# Patient Record
Sex: Female | Born: 1950 | Race: White | Hispanic: Yes | Marital: Married
Health system: Southern US, Community
[De-identification: ages and names within clinical notes are randomized; demographics above are authoritative.]

## PROBLEM LIST (undated history)

## (undated) HISTORY — PX: BREAST EXCISIONAL BIOPSY: SUR124

---

## 2005-11-30 ENCOUNTER — Other Ambulatory Visit: Admission: RE | Admit: 2005-11-30 | Discharge: 2005-11-30 | Payer: Self-pay | Admitting: Obstetrics and Gynecology

## 2006-01-22 ENCOUNTER — Encounter: Admission: RE | Admit: 2006-01-22 | Discharge: 2006-01-22 | Payer: Self-pay | Admitting: Obstetrics and Gynecology

## 2006-09-04 ENCOUNTER — Encounter: Admission: RE | Admit: 2006-09-04 | Discharge: 2006-09-04 | Payer: Self-pay | Admitting: Family Medicine

## 2007-02-07 ENCOUNTER — Other Ambulatory Visit: Admission: RE | Admit: 2007-02-07 | Discharge: 2007-02-07 | Payer: Self-pay | Admitting: Obstetrics and Gynecology

## 2007-02-27 ENCOUNTER — Encounter: Admission: RE | Admit: 2007-02-27 | Discharge: 2007-02-27 | Payer: Self-pay | Admitting: Obstetrics and Gynecology

## 2008-03-04 ENCOUNTER — Encounter: Admission: RE | Admit: 2008-03-04 | Discharge: 2008-03-04 | Payer: Self-pay | Admitting: Obstetrics and Gynecology

## 2008-03-10 ENCOUNTER — Encounter: Admission: RE | Admit: 2008-03-10 | Discharge: 2008-03-10 | Payer: Self-pay | Admitting: Obstetrics and Gynecology

## 2009-04-22 ENCOUNTER — Encounter: Admission: RE | Admit: 2009-04-22 | Discharge: 2009-04-22 | Payer: Self-pay | Admitting: Obstetrics and Gynecology

## 2010-04-12 ENCOUNTER — Other Ambulatory Visit: Payer: Self-pay | Admitting: Obstetrics and Gynecology

## 2010-04-12 DIAGNOSIS — Z1231 Encounter for screening mammogram for malignant neoplasm of breast: Secondary | ICD-10-CM

## 2010-05-02 ENCOUNTER — Ambulatory Visit
Admission: RE | Admit: 2010-05-02 | Discharge: 2010-05-02 | Disposition: A | Discharge status: Home / Self Care | Payer: BC Managed Care – PPO | Source: Ambulatory Visit | Attending: Obstetrics and Gynecology | Admitting: Obstetrics and Gynecology

## 2010-05-02 DIAGNOSIS — Z1231 Encounter for screening mammogram for malignant neoplasm of breast: Secondary | ICD-10-CM

## 2011-09-12 ENCOUNTER — Other Ambulatory Visit: Payer: Self-pay | Admitting: Obstetrics and Gynecology

## 2011-09-12 DIAGNOSIS — Z1231 Encounter for screening mammogram for malignant neoplasm of breast: Secondary | ICD-10-CM

## 2011-10-02 ENCOUNTER — Ambulatory Visit
Admission: RE | Admit: 2011-10-02 | Discharge: 2011-10-02 | Disposition: A | Discharge status: Home / Self Care | Payer: BC Managed Care – PPO | Source: Ambulatory Visit | Attending: Obstetrics and Gynecology | Admitting: Obstetrics and Gynecology

## 2011-10-02 DIAGNOSIS — Z1231 Encounter for screening mammogram for malignant neoplasm of breast: Secondary | ICD-10-CM

## 2011-11-28 ENCOUNTER — Other Ambulatory Visit (HOSPITAL_COMMUNITY)
Admission: RE | Admit: 2011-11-28 | Discharge: 2011-11-28 | Disposition: A | Discharge status: Home / Self Care | Payer: BC Managed Care – PPO | Source: Ambulatory Visit | Attending: Obstetrics and Gynecology | Admitting: Obstetrics and Gynecology

## 2011-11-28 ENCOUNTER — Other Ambulatory Visit: Payer: Self-pay | Admitting: Obstetrics and Gynecology

## 2011-11-28 DIAGNOSIS — Z01419 Encounter for gynecological examination (general) (routine) without abnormal findings: Secondary | ICD-10-CM | POA: Insufficient documentation

## 2012-12-30 ENCOUNTER — Other Ambulatory Visit: Payer: Self-pay

## 2012-12-30 DIAGNOSIS — Z1231 Encounter for screening mammogram for malignant neoplasm of breast: Secondary | ICD-10-CM

## 2012-12-31 ENCOUNTER — Other Ambulatory Visit (HOSPITAL_COMMUNITY)
Admission: RE | Admit: 2012-12-31 | Discharge: 2012-12-31 | Disposition: A | Discharge status: Home / Self Care | Payer: BC Managed Care – PPO | Source: Ambulatory Visit | Attending: Obstetrics and Gynecology | Admitting: Obstetrics and Gynecology

## 2012-12-31 ENCOUNTER — Other Ambulatory Visit: Payer: Self-pay | Admitting: Obstetrics and Gynecology

## 2012-12-31 DIAGNOSIS — Z1151 Encounter for screening for human papillomavirus (HPV): Secondary | ICD-10-CM | POA: Insufficient documentation

## 2012-12-31 DIAGNOSIS — Z01419 Encounter for gynecological examination (general) (routine) without abnormal findings: Secondary | ICD-10-CM | POA: Insufficient documentation

## 2013-01-24 ENCOUNTER — Ambulatory Visit
Admission: RE | Admit: 2013-01-24 | Discharge: 2013-01-24 | Disposition: A | Discharge status: Home / Self Care | Payer: BC Managed Care – PPO | Source: Ambulatory Visit

## 2013-01-24 DIAGNOSIS — Z1231 Encounter for screening mammogram for malignant neoplasm of breast: Secondary | ICD-10-CM

## 2016-01-18 DIAGNOSIS — Z23 Encounter for immunization: Secondary | ICD-10-CM | POA: Diagnosis not present

## 2016-01-18 DIAGNOSIS — S46911A Strain of unspecified muscle, fascia and tendon at shoulder and upper arm level, right arm, initial encounter: Secondary | ICD-10-CM | POA: Diagnosis not present

## 2016-01-18 DIAGNOSIS — Z1211 Encounter for screening for malignant neoplasm of colon: Secondary | ICD-10-CM | POA: Diagnosis not present

## 2016-01-18 DIAGNOSIS — E7800 Pure hypercholesterolemia, unspecified: Secondary | ICD-10-CM | POA: Diagnosis not present

## 2016-01-18 DIAGNOSIS — E039 Hypothyroidism, unspecified: Secondary | ICD-10-CM | POA: Diagnosis not present

## 2016-01-18 DIAGNOSIS — Z79899 Other long term (current) drug therapy: Secondary | ICD-10-CM | POA: Diagnosis not present

## 2016-04-17 DIAGNOSIS — E039 Hypothyroidism, unspecified: Secondary | ICD-10-CM | POA: Diagnosis not present

## 2016-07-20 DIAGNOSIS — E039 Hypothyroidism, unspecified: Secondary | ICD-10-CM | POA: Diagnosis not present

## 2016-07-20 DIAGNOSIS — Z23 Encounter for immunization: Secondary | ICD-10-CM | POA: Diagnosis not present

## 2016-07-20 DIAGNOSIS — Z0000 Encounter for general adult medical examination without abnormal findings: Secondary | ICD-10-CM | POA: Diagnosis not present

## 2016-07-20 DIAGNOSIS — E7800 Pure hypercholesterolemia, unspecified: Secondary | ICD-10-CM | POA: Diagnosis not present

## 2016-07-20 DIAGNOSIS — Z79899 Other long term (current) drug therapy: Secondary | ICD-10-CM | POA: Diagnosis not present

## 2016-07-20 DIAGNOSIS — Z720 Tobacco use: Secondary | ICD-10-CM | POA: Diagnosis not present

## 2016-08-25 DIAGNOSIS — M7711 Lateral epicondylitis, right elbow: Secondary | ICD-10-CM | POA: Diagnosis not present

## 2016-08-25 DIAGNOSIS — M9901 Segmental and somatic dysfunction of cervical region: Secondary | ICD-10-CM | POA: Diagnosis not present

## 2016-08-25 DIAGNOSIS — M9907 Segmental and somatic dysfunction of upper extremity: Secondary | ICD-10-CM | POA: Diagnosis not present

## 2016-08-25 DIAGNOSIS — M9902 Segmental and somatic dysfunction of thoracic region: Secondary | ICD-10-CM | POA: Diagnosis not present

## 2016-08-31 DIAGNOSIS — M9901 Segmental and somatic dysfunction of cervical region: Secondary | ICD-10-CM | POA: Diagnosis not present

## 2016-08-31 DIAGNOSIS — M9907 Segmental and somatic dysfunction of upper extremity: Secondary | ICD-10-CM | POA: Diagnosis not present

## 2016-08-31 DIAGNOSIS — M7711 Lateral epicondylitis, right elbow: Secondary | ICD-10-CM | POA: Diagnosis not present

## 2016-08-31 DIAGNOSIS — M9902 Segmental and somatic dysfunction of thoracic region: Secondary | ICD-10-CM | POA: Diagnosis not present

## 2016-09-06 DIAGNOSIS — M9907 Segmental and somatic dysfunction of upper extremity: Secondary | ICD-10-CM | POA: Diagnosis not present

## 2016-09-06 DIAGNOSIS — M7711 Lateral epicondylitis, right elbow: Secondary | ICD-10-CM | POA: Diagnosis not present

## 2016-09-06 DIAGNOSIS — M9901 Segmental and somatic dysfunction of cervical region: Secondary | ICD-10-CM | POA: Diagnosis not present

## 2016-09-06 DIAGNOSIS — M9902 Segmental and somatic dysfunction of thoracic region: Secondary | ICD-10-CM | POA: Diagnosis not present

## 2016-09-06 DIAGNOSIS — M19021 Primary osteoarthritis, right elbow: Secondary | ICD-10-CM | POA: Diagnosis not present

## 2016-09-12 DIAGNOSIS — M9907 Segmental and somatic dysfunction of upper extremity: Secondary | ICD-10-CM | POA: Diagnosis not present

## 2016-09-12 DIAGNOSIS — M7711 Lateral epicondylitis, right elbow: Secondary | ICD-10-CM | POA: Diagnosis not present

## 2016-09-12 DIAGNOSIS — M9901 Segmental and somatic dysfunction of cervical region: Secondary | ICD-10-CM | POA: Diagnosis not present

## 2016-09-12 DIAGNOSIS — M9902 Segmental and somatic dysfunction of thoracic region: Secondary | ICD-10-CM | POA: Diagnosis not present

## 2016-09-20 DIAGNOSIS — M9901 Segmental and somatic dysfunction of cervical region: Secondary | ICD-10-CM | POA: Diagnosis not present

## 2016-09-20 DIAGNOSIS — M7711 Lateral epicondylitis, right elbow: Secondary | ICD-10-CM | POA: Diagnosis not present

## 2016-09-20 DIAGNOSIS — M9907 Segmental and somatic dysfunction of upper extremity: Secondary | ICD-10-CM | POA: Diagnosis not present

## 2016-09-20 DIAGNOSIS — M9902 Segmental and somatic dysfunction of thoracic region: Secondary | ICD-10-CM | POA: Diagnosis not present

## 2016-09-28 ENCOUNTER — Other Ambulatory Visit: Payer: Self-pay | Admitting: Obstetrics and Gynecology

## 2016-09-28 DIAGNOSIS — Z1231 Encounter for screening mammogram for malignant neoplasm of breast: Secondary | ICD-10-CM

## 2016-10-08 DIAGNOSIS — M5489 Other dorsalgia: Secondary | ICD-10-CM | POA: Diagnosis not present

## 2016-10-11 DIAGNOSIS — M9907 Segmental and somatic dysfunction of upper extremity: Secondary | ICD-10-CM | POA: Diagnosis not present

## 2016-10-11 DIAGNOSIS — M9901 Segmental and somatic dysfunction of cervical region: Secondary | ICD-10-CM | POA: Diagnosis not present

## 2016-10-11 DIAGNOSIS — M9902 Segmental and somatic dysfunction of thoracic region: Secondary | ICD-10-CM | POA: Diagnosis not present

## 2016-10-11 DIAGNOSIS — M7711 Lateral epicondylitis, right elbow: Secondary | ICD-10-CM | POA: Diagnosis not present

## 2016-10-13 DIAGNOSIS — M9902 Segmental and somatic dysfunction of thoracic region: Secondary | ICD-10-CM | POA: Diagnosis not present

## 2016-10-13 DIAGNOSIS — M9907 Segmental and somatic dysfunction of upper extremity: Secondary | ICD-10-CM | POA: Diagnosis not present

## 2016-10-13 DIAGNOSIS — M7711 Lateral epicondylitis, right elbow: Secondary | ICD-10-CM | POA: Diagnosis not present

## 2016-10-13 DIAGNOSIS — M9901 Segmental and somatic dysfunction of cervical region: Secondary | ICD-10-CM | POA: Diagnosis not present

## 2016-10-18 ENCOUNTER — Other Ambulatory Visit: Payer: Self-pay | Admitting: Obstetrics and Gynecology

## 2016-10-18 ENCOUNTER — Other Ambulatory Visit (HOSPITAL_COMMUNITY)
Admission: RE | Admit: 2016-10-18 | Discharge: 2016-10-18 | Disposition: A | Discharge status: Home / Self Care | Payer: Medicare Other | Source: Ambulatory Visit | Attending: Obstetrics and Gynecology | Admitting: Obstetrics and Gynecology

## 2016-10-18 DIAGNOSIS — Z124 Encounter for screening for malignant neoplasm of cervix: Secondary | ICD-10-CM | POA: Insufficient documentation

## 2016-10-18 DIAGNOSIS — M9907 Segmental and somatic dysfunction of upper extremity: Secondary | ICD-10-CM | POA: Diagnosis not present

## 2016-10-18 DIAGNOSIS — M9902 Segmental and somatic dysfunction of thoracic region: Secondary | ICD-10-CM | POA: Diagnosis not present

## 2016-10-18 DIAGNOSIS — Z01419 Encounter for gynecological examination (general) (routine) without abnormal findings: Secondary | ICD-10-CM | POA: Diagnosis not present

## 2016-10-18 DIAGNOSIS — M9901 Segmental and somatic dysfunction of cervical region: Secondary | ICD-10-CM | POA: Diagnosis not present

## 2016-10-18 DIAGNOSIS — M7711 Lateral epicondylitis, right elbow: Secondary | ICD-10-CM | POA: Diagnosis not present

## 2016-10-20 LAB — CYTOLOGY - PAP
Diagnosis: NEGATIVE
HPV (WINDOPATH): NOT DETECTED

## 2016-10-25 ENCOUNTER — Ambulatory Visit
Admission: RE | Admit: 2016-10-25 | Discharge: 2016-10-25 | Disposition: A | Discharge status: Home / Self Care | Payer: Medicare Other | Source: Ambulatory Visit | Attending: Obstetrics and Gynecology | Admitting: Obstetrics and Gynecology

## 2016-10-25 DIAGNOSIS — M9901 Segmental and somatic dysfunction of cervical region: Secondary | ICD-10-CM | POA: Diagnosis not present

## 2016-10-25 DIAGNOSIS — M9907 Segmental and somatic dysfunction of upper extremity: Secondary | ICD-10-CM | POA: Diagnosis not present

## 2016-10-25 DIAGNOSIS — M7711 Lateral epicondylitis, right elbow: Secondary | ICD-10-CM | POA: Diagnosis not present

## 2016-10-25 DIAGNOSIS — M9902 Segmental and somatic dysfunction of thoracic region: Secondary | ICD-10-CM | POA: Diagnosis not present

## 2016-10-25 DIAGNOSIS — Z1231 Encounter for screening mammogram for malignant neoplasm of breast: Secondary | ICD-10-CM | POA: Diagnosis not present

## 2016-10-26 ENCOUNTER — Other Ambulatory Visit: Payer: Self-pay | Admitting: Obstetrics and Gynecology

## 2016-10-26 DIAGNOSIS — R928 Other abnormal and inconclusive findings on diagnostic imaging of breast: Secondary | ICD-10-CM

## 2016-11-08 ENCOUNTER — Ambulatory Visit: Payer: Medicare Other

## 2016-11-08 ENCOUNTER — Ambulatory Visit
Admission: RE | Admit: 2016-11-08 | Discharge: 2016-11-08 | Disposition: A | Discharge status: Home / Self Care | Payer: Medicare Other | Source: Ambulatory Visit | Attending: Obstetrics and Gynecology | Admitting: Obstetrics and Gynecology

## 2016-11-08 DIAGNOSIS — M9903 Segmental and somatic dysfunction of lumbar region: Secondary | ICD-10-CM | POA: Diagnosis not present

## 2016-11-08 DIAGNOSIS — M9902 Segmental and somatic dysfunction of thoracic region: Secondary | ICD-10-CM | POA: Diagnosis not present

## 2016-11-08 DIAGNOSIS — R928 Other abnormal and inconclusive findings on diagnostic imaging of breast: Secondary | ICD-10-CM

## 2016-11-08 DIAGNOSIS — M9907 Segmental and somatic dysfunction of upper extremity: Secondary | ICD-10-CM | POA: Diagnosis not present

## 2016-11-08 DIAGNOSIS — M9901 Segmental and somatic dysfunction of cervical region: Secondary | ICD-10-CM | POA: Diagnosis not present

## 2016-11-30 DIAGNOSIS — M9907 Segmental and somatic dysfunction of upper extremity: Secondary | ICD-10-CM | POA: Diagnosis not present

## 2016-11-30 DIAGNOSIS — M9901 Segmental and somatic dysfunction of cervical region: Secondary | ICD-10-CM | POA: Diagnosis not present

## 2016-11-30 DIAGNOSIS — M9902 Segmental and somatic dysfunction of thoracic region: Secondary | ICD-10-CM | POA: Diagnosis not present

## 2016-11-30 DIAGNOSIS — M9903 Segmental and somatic dysfunction of lumbar region: Secondary | ICD-10-CM | POA: Diagnosis not present

## 2016-12-11 DIAGNOSIS — M9907 Segmental and somatic dysfunction of upper extremity: Secondary | ICD-10-CM | POA: Diagnosis not present

## 2016-12-11 DIAGNOSIS — M9903 Segmental and somatic dysfunction of lumbar region: Secondary | ICD-10-CM | POA: Diagnosis not present

## 2016-12-11 DIAGNOSIS — M9902 Segmental and somatic dysfunction of thoracic region: Secondary | ICD-10-CM | POA: Diagnosis not present

## 2016-12-11 DIAGNOSIS — M9901 Segmental and somatic dysfunction of cervical region: Secondary | ICD-10-CM | POA: Diagnosis not present

## 2017-01-01 DIAGNOSIS — M9903 Segmental and somatic dysfunction of lumbar region: Secondary | ICD-10-CM | POA: Diagnosis not present

## 2017-01-01 DIAGNOSIS — M9902 Segmental and somatic dysfunction of thoracic region: Secondary | ICD-10-CM | POA: Diagnosis not present

## 2017-01-01 DIAGNOSIS — M9901 Segmental and somatic dysfunction of cervical region: Secondary | ICD-10-CM | POA: Diagnosis not present

## 2017-01-01 DIAGNOSIS — M9907 Segmental and somatic dysfunction of upper extremity: Secondary | ICD-10-CM | POA: Diagnosis not present

## 2017-01-22 DIAGNOSIS — Z23 Encounter for immunization: Secondary | ICD-10-CM | POA: Diagnosis not present

## 2017-07-16 DIAGNOSIS — H353121 Nonexudative age-related macular degeneration, left eye, early dry stage: Secondary | ICD-10-CM | POA: Diagnosis not present

## 2017-07-16 DIAGNOSIS — H2513 Age-related nuclear cataract, bilateral: Secondary | ICD-10-CM | POA: Diagnosis not present

## 2017-09-27 ENCOUNTER — Other Ambulatory Visit: Payer: Self-pay | Admitting: Obstetrics and Gynecology

## 2017-09-27 DIAGNOSIS — Z1231 Encounter for screening mammogram for malignant neoplasm of breast: Secondary | ICD-10-CM

## 2017-10-26 ENCOUNTER — Ambulatory Visit
Admission: RE | Admit: 2017-10-26 | Discharge: 2017-10-26 | Disposition: A | Discharge status: Home / Self Care | Payer: Medicare Other | Source: Ambulatory Visit | Attending: Obstetrics and Gynecology | Admitting: Obstetrics and Gynecology

## 2017-10-26 DIAGNOSIS — Z1231 Encounter for screening mammogram for malignant neoplasm of breast: Secondary | ICD-10-CM

## 2017-11-14 DIAGNOSIS — E039 Hypothyroidism, unspecified: Secondary | ICD-10-CM | POA: Diagnosis not present

## 2017-11-14 DIAGNOSIS — E7800 Pure hypercholesterolemia, unspecified: Secondary | ICD-10-CM | POA: Diagnosis not present

## 2017-11-14 DIAGNOSIS — Z23 Encounter for immunization: Secondary | ICD-10-CM | POA: Diagnosis not present

## 2017-11-14 DIAGNOSIS — Z720 Tobacco use: Secondary | ICD-10-CM | POA: Diagnosis not present

## 2017-11-14 DIAGNOSIS — Z79899 Other long term (current) drug therapy: Secondary | ICD-10-CM | POA: Diagnosis not present

## 2017-11-14 DIAGNOSIS — Z0001 Encounter for general adult medical examination with abnormal findings: Secondary | ICD-10-CM | POA: Diagnosis not present

## 2018-02-28 DIAGNOSIS — K2900 Acute gastritis without bleeding: Secondary | ICD-10-CM | POA: Diagnosis not present

## 2018-02-28 DIAGNOSIS — R109 Unspecified abdominal pain: Secondary | ICD-10-CM | POA: Diagnosis not present

## 2018-02-28 DIAGNOSIS — F418 Other specified anxiety disorders: Secondary | ICD-10-CM | POA: Diagnosis not present

## 2018-07-23 DIAGNOSIS — H353131 Nonexudative age-related macular degeneration, bilateral, early dry stage: Secondary | ICD-10-CM | POA: Diagnosis not present

## 2018-07-23 DIAGNOSIS — H2513 Age-related nuclear cataract, bilateral: Secondary | ICD-10-CM | POA: Diagnosis not present

## 2018-09-18 ENCOUNTER — Other Ambulatory Visit: Payer: Self-pay | Admitting: Obstetrics and Gynecology

## 2018-09-18 DIAGNOSIS — Z1231 Encounter for screening mammogram for malignant neoplasm of breast: Secondary | ICD-10-CM

## 2018-10-21 DIAGNOSIS — Z01419 Encounter for gynecological examination (general) (routine) without abnormal findings: Secondary | ICD-10-CM | POA: Diagnosis not present

## 2018-10-21 DIAGNOSIS — M8580 Other specified disorders of bone density and structure, unspecified site: Secondary | ICD-10-CM | POA: Diagnosis not present

## 2018-10-22 ENCOUNTER — Other Ambulatory Visit: Payer: Self-pay | Admitting: Obstetrics and Gynecology

## 2018-10-22 DIAGNOSIS — M8580 Other specified disorders of bone density and structure, unspecified site: Secondary | ICD-10-CM

## 2018-11-07 ENCOUNTER — Ambulatory Visit
Admission: RE | Admit: 2018-11-07 | Discharge: 2018-11-07 | Disposition: A | Discharge status: Home / Self Care | Payer: Medicare Other | Source: Ambulatory Visit | Attending: Obstetrics and Gynecology | Admitting: Obstetrics and Gynecology

## 2018-11-07 ENCOUNTER — Other Ambulatory Visit: Payer: Self-pay

## 2018-11-07 DIAGNOSIS — Z1231 Encounter for screening mammogram for malignant neoplasm of breast: Secondary | ICD-10-CM | POA: Diagnosis not present

## 2018-11-26 DIAGNOSIS — Z1211 Encounter for screening for malignant neoplasm of colon: Secondary | ICD-10-CM | POA: Diagnosis not present

## 2018-11-26 DIAGNOSIS — Z79899 Other long term (current) drug therapy: Secondary | ICD-10-CM | POA: Diagnosis not present

## 2018-11-26 DIAGNOSIS — Z0001 Encounter for general adult medical examination with abnormal findings: Secondary | ICD-10-CM | POA: Diagnosis not present

## 2018-11-26 DIAGNOSIS — E7800 Pure hypercholesterolemia, unspecified: Secondary | ICD-10-CM | POA: Diagnosis not present

## 2018-11-26 DIAGNOSIS — E039 Hypothyroidism, unspecified: Secondary | ICD-10-CM | POA: Diagnosis not present

## 2018-11-26 DIAGNOSIS — Z23 Encounter for immunization: Secondary | ICD-10-CM | POA: Diagnosis not present

## 2018-11-26 DIAGNOSIS — Z720 Tobacco use: Secondary | ICD-10-CM | POA: Diagnosis not present

## 2018-11-28 DIAGNOSIS — Z1211 Encounter for screening for malignant neoplasm of colon: Secondary | ICD-10-CM | POA: Diagnosis not present

## 2019-01-02 ENCOUNTER — Other Ambulatory Visit: Payer: Self-pay

## 2019-01-02 ENCOUNTER — Ambulatory Visit
Admission: RE | Admit: 2019-01-02 | Discharge: 2019-01-02 | Disposition: A | Discharge status: Home / Self Care | Payer: Medicare Other | Source: Ambulatory Visit | Attending: Obstetrics and Gynecology | Admitting: Obstetrics and Gynecology

## 2019-01-02 DIAGNOSIS — Z780 Asymptomatic menopausal state: Secondary | ICD-10-CM | POA: Diagnosis not present

## 2019-01-02 DIAGNOSIS — M8588 Other specified disorders of bone density and structure, other site: Secondary | ICD-10-CM | POA: Diagnosis not present

## 2019-01-02 DIAGNOSIS — M810 Age-related osteoporosis without current pathological fracture: Secondary | ICD-10-CM | POA: Diagnosis not present

## 2019-01-02 DIAGNOSIS — M8580 Other specified disorders of bone density and structure, unspecified site: Secondary | ICD-10-CM

## 2019-04-06 ENCOUNTER — Ambulatory Visit: Payer: Medicare Other | Attending: Internal Medicine

## 2019-04-06 DIAGNOSIS — Z23 Encounter for immunization: Secondary | ICD-10-CM | POA: Insufficient documentation

## 2019-04-06 NOTE — Progress Notes (Signed)
   Covid-19 Vaccination Clinic  Name:  Debra Barton    MRN: [id number] DOB: [birth year]  04/06/2019  Debra Barton was observed post Covid-19 immunization for 15 minutes without incidence. She was provided with Vaccine Information Sheet and instruction to access the V-Safe system.   Debra Barton was instructed to call 911 with any severe reactions post vaccine: Marland Kitchen Difficulty breathing  . Swelling of your face and throat  . A fast heartbeat  . A bad rash all over your body  . Dizziness and weakness    Immunizations Administered    Name Date Dose VIS Date Route   Pfizer COVID-19 Vaccine 04/06/2019  5:05 PM 0.3 mL 02/07/2019 Intramuscular   Manufacturer: [employer], [employer]   Lot: [id number]   NDC: [id number]

## 2019-04-27 ENCOUNTER — Ambulatory Visit: Payer: Medicare Other

## 2019-05-01 ENCOUNTER — Ambulatory Visit: Payer: Medicare Other | Attending: Internal Medicine

## 2019-05-01 DIAGNOSIS — Z23 Encounter for immunization: Secondary | ICD-10-CM | POA: Insufficient documentation

## 2019-05-01 NOTE — Progress Notes (Signed)
   Covid-19 Vaccination Clinic  Name:  Raseel Jans    MRN: [id number] DOB: 28-Mar-1950  05/01/2019  Ms. Albright was observed post Covid-19 immunization for 15 minutes without incident. She was provided with Vaccine Information Sheet and instruction to access the V-Safe system.   Ms. Leopard was instructed to call 911 with any severe reactions post vaccine: Marland Kitchen Difficulty breathing  . Swelling of face and throat  . A fast heartbeat  . A bad rash all over body  . Dizziness and weakness   Immunizations Administered    Name Date Dose VIS Date Route   Pfizer COVID-19 Vaccine 05/01/2019  1:22 PM 0.3 mL 02/07/2019 Intramuscular   Manufacturer: [employer], [employer]   Lot: [id number]   NDC: [address]-0

## 2019-07-30 DIAGNOSIS — H353131 Nonexudative age-related macular degeneration, bilateral, early dry stage: Secondary | ICD-10-CM | POA: Diagnosis not present

## 2019-07-30 DIAGNOSIS — H2513 Age-related nuclear cataract, bilateral: Secondary | ICD-10-CM | POA: Diagnosis not present

## 2019-10-14 ENCOUNTER — Other Ambulatory Visit: Payer: Self-pay | Admitting: Obstetrics and Gynecology

## 2019-10-14 DIAGNOSIS — Z1231 Encounter for screening mammogram for malignant neoplasm of breast: Secondary | ICD-10-CM

## 2019-11-05 DIAGNOSIS — Z23 Encounter for immunization: Secondary | ICD-10-CM | POA: Diagnosis not present

## 2019-11-10 DIAGNOSIS — L820 Inflamed seborrheic keratosis: Secondary | ICD-10-CM | POA: Diagnosis not present

## 2019-11-11 ENCOUNTER — Ambulatory Visit
Admission: RE | Admit: 2019-11-11 | Discharge: 2019-11-11 | Disposition: A | Discharge status: Home / Self Care | Payer: Medicare Other | Source: Ambulatory Visit | Attending: Obstetrics and Gynecology | Admitting: Obstetrics and Gynecology

## 2019-11-11 ENCOUNTER — Other Ambulatory Visit: Payer: Self-pay

## 2019-11-11 DIAGNOSIS — Z1231 Encounter for screening mammogram for malignant neoplasm of breast: Secondary | ICD-10-CM | POA: Diagnosis not present

## 2019-12-17 DIAGNOSIS — Z0001 Encounter for general adult medical examination with abnormal findings: Secondary | ICD-10-CM | POA: Diagnosis not present

## 2019-12-17 DIAGNOSIS — M810 Age-related osteoporosis without current pathological fracture: Secondary | ICD-10-CM | POA: Diagnosis not present

## 2019-12-17 DIAGNOSIS — E039 Hypothyroidism, unspecified: Secondary | ICD-10-CM | POA: Diagnosis not present

## 2019-12-17 DIAGNOSIS — Z79899 Other long term (current) drug therapy: Secondary | ICD-10-CM | POA: Diagnosis not present

## 2019-12-17 DIAGNOSIS — F418 Other specified anxiety disorders: Secondary | ICD-10-CM | POA: Diagnosis not present

## 2019-12-17 DIAGNOSIS — E7800 Pure hypercholesterolemia, unspecified: Secondary | ICD-10-CM | POA: Diagnosis not present

## 2019-12-17 DIAGNOSIS — Z720 Tobacco use: Secondary | ICD-10-CM | POA: Diagnosis not present

## 2019-12-30 DIAGNOSIS — Z1211 Encounter for screening for malignant neoplasm of colon: Secondary | ICD-10-CM | POA: Diagnosis not present

## 2020-01-10 ENCOUNTER — Ambulatory Visit: Payer: Medicare Other | Attending: Internal Medicine

## 2020-01-10 DIAGNOSIS — Z23 Encounter for immunization: Secondary | ICD-10-CM

## 2020-01-10 NOTE — Progress Notes (Signed)
   Covid-19 Vaccination Clinic  Name:  Debra Barton    MRN: [id number] DOB: [birth year]  01/10/2020  [name] was observed post Covid-19 immunization for 15 minutes without incident. She was provided with Vaccine Information Sheet and instruction to access the V-Safe system.   [name] was instructed to call 911 with any severe reactions post vaccine: Marland Kitchen Difficulty breathing  . Swelling of face and throat  . A fast heartbeat  . A bad rash all over body  . Dizziness and weakness   Immunizations Administered    Name Date Dose VIS Date Route   Pfizer COVID-19 Vaccine 01/10/2020 12:50 PM 0.3 mL 12/17/2019 Intramuscular   Manufacturer: [employer], [employer]   Lot: [id number]   NDC: [address]-2

## 2020-03-02 DIAGNOSIS — M9902 Segmental and somatic dysfunction of thoracic region: Secondary | ICD-10-CM | POA: Diagnosis not present

## 2020-03-02 DIAGNOSIS — M9901 Segmental and somatic dysfunction of cervical region: Secondary | ICD-10-CM | POA: Diagnosis not present

## 2020-03-02 DIAGNOSIS — M9907 Segmental and somatic dysfunction of upper extremity: Secondary | ICD-10-CM | POA: Diagnosis not present

## 2020-03-02 DIAGNOSIS — M9903 Segmental and somatic dysfunction of lumbar region: Secondary | ICD-10-CM | POA: Diagnosis not present

## 2020-03-08 DIAGNOSIS — M9903 Segmental and somatic dysfunction of lumbar region: Secondary | ICD-10-CM | POA: Diagnosis not present

## 2020-03-08 DIAGNOSIS — M9907 Segmental and somatic dysfunction of upper extremity: Secondary | ICD-10-CM | POA: Diagnosis not present

## 2020-03-08 DIAGNOSIS — M9901 Segmental and somatic dysfunction of cervical region: Secondary | ICD-10-CM | POA: Diagnosis not present

## 2020-03-08 DIAGNOSIS — M9902 Segmental and somatic dysfunction of thoracic region: Secondary | ICD-10-CM | POA: Diagnosis not present

## 2020-03-23 DIAGNOSIS — M9907 Segmental and somatic dysfunction of upper extremity: Secondary | ICD-10-CM | POA: Diagnosis not present

## 2020-03-23 DIAGNOSIS — M9902 Segmental and somatic dysfunction of thoracic region: Secondary | ICD-10-CM | POA: Diagnosis not present

## 2020-03-23 DIAGNOSIS — M9903 Segmental and somatic dysfunction of lumbar region: Secondary | ICD-10-CM | POA: Diagnosis not present

## 2020-03-23 DIAGNOSIS — M9901 Segmental and somatic dysfunction of cervical region: Secondary | ICD-10-CM | POA: Diagnosis not present

## 2020-04-23 DIAGNOSIS — M9907 Segmental and somatic dysfunction of upper extremity: Secondary | ICD-10-CM | POA: Diagnosis not present

## 2020-04-23 DIAGNOSIS — M9901 Segmental and somatic dysfunction of cervical region: Secondary | ICD-10-CM | POA: Diagnosis not present

## 2020-04-23 DIAGNOSIS — M9903 Segmental and somatic dysfunction of lumbar region: Secondary | ICD-10-CM | POA: Diagnosis not present

## 2020-04-23 DIAGNOSIS — M9902 Segmental and somatic dysfunction of thoracic region: Secondary | ICD-10-CM | POA: Diagnosis not present

## 2020-05-14 DIAGNOSIS — M9901 Segmental and somatic dysfunction of cervical region: Secondary | ICD-10-CM | POA: Diagnosis not present

## 2020-05-14 DIAGNOSIS — M9902 Segmental and somatic dysfunction of thoracic region: Secondary | ICD-10-CM | POA: Diagnosis not present

## 2020-05-14 DIAGNOSIS — M9907 Segmental and somatic dysfunction of upper extremity: Secondary | ICD-10-CM | POA: Diagnosis not present

## 2020-05-14 DIAGNOSIS — M9903 Segmental and somatic dysfunction of lumbar region: Secondary | ICD-10-CM | POA: Diagnosis not present

## 2020-06-04 DIAGNOSIS — M9901 Segmental and somatic dysfunction of cervical region: Secondary | ICD-10-CM | POA: Diagnosis not present

## 2020-06-04 DIAGNOSIS — M9902 Segmental and somatic dysfunction of thoracic region: Secondary | ICD-10-CM | POA: Diagnosis not present

## 2020-06-04 DIAGNOSIS — M9907 Segmental and somatic dysfunction of upper extremity: Secondary | ICD-10-CM | POA: Diagnosis not present

## 2020-06-04 DIAGNOSIS — M9903 Segmental and somatic dysfunction of lumbar region: Secondary | ICD-10-CM | POA: Diagnosis not present

## 2020-06-22 DIAGNOSIS — M9901 Segmental and somatic dysfunction of cervical region: Secondary | ICD-10-CM | POA: Diagnosis not present

## 2020-06-22 DIAGNOSIS — M9902 Segmental and somatic dysfunction of thoracic region: Secondary | ICD-10-CM | POA: Diagnosis not present

## 2020-06-22 DIAGNOSIS — M9903 Segmental and somatic dysfunction of lumbar region: Secondary | ICD-10-CM | POA: Diagnosis not present

## 2020-06-22 DIAGNOSIS — M9907 Segmental and somatic dysfunction of upper extremity: Secondary | ICD-10-CM | POA: Diagnosis not present

## 2020-08-02 DIAGNOSIS — H353131 Nonexudative age-related macular degeneration, bilateral, early dry stage: Secondary | ICD-10-CM | POA: Diagnosis not present

## 2020-08-02 DIAGNOSIS — H2513 Age-related nuclear cataract, bilateral: Secondary | ICD-10-CM | POA: Diagnosis not present

## 2020-08-17 DIAGNOSIS — M9903 Segmental and somatic dysfunction of lumbar region: Secondary | ICD-10-CM | POA: Diagnosis not present

## 2020-08-17 DIAGNOSIS — M9902 Segmental and somatic dysfunction of thoracic region: Secondary | ICD-10-CM | POA: Diagnosis not present

## 2020-08-17 DIAGNOSIS — M9901 Segmental and somatic dysfunction of cervical region: Secondary | ICD-10-CM | POA: Diagnosis not present

## 2020-08-17 DIAGNOSIS — M9907 Segmental and somatic dysfunction of upper extremity: Secondary | ICD-10-CM | POA: Diagnosis not present

## 2020-09-02 DIAGNOSIS — Z23 Encounter for immunization: Secondary | ICD-10-CM | POA: Diagnosis not present

## 2020-09-07 DIAGNOSIS — M9903 Segmental and somatic dysfunction of lumbar region: Secondary | ICD-10-CM | POA: Diagnosis not present

## 2020-09-07 DIAGNOSIS — M9902 Segmental and somatic dysfunction of thoracic region: Secondary | ICD-10-CM | POA: Diagnosis not present

## 2020-09-07 DIAGNOSIS — M9907 Segmental and somatic dysfunction of upper extremity: Secondary | ICD-10-CM | POA: Diagnosis not present

## 2020-09-07 DIAGNOSIS — M9901 Segmental and somatic dysfunction of cervical region: Secondary | ICD-10-CM | POA: Diagnosis not present

## 2020-10-05 DIAGNOSIS — M9907 Segmental and somatic dysfunction of upper extremity: Secondary | ICD-10-CM | POA: Diagnosis not present

## 2020-10-05 DIAGNOSIS — M9901 Segmental and somatic dysfunction of cervical region: Secondary | ICD-10-CM | POA: Diagnosis not present

## 2020-10-05 DIAGNOSIS — M9903 Segmental and somatic dysfunction of lumbar region: Secondary | ICD-10-CM | POA: Diagnosis not present

## 2020-10-05 DIAGNOSIS — M9902 Segmental and somatic dysfunction of thoracic region: Secondary | ICD-10-CM | POA: Diagnosis not present

## 2020-10-14 ENCOUNTER — Other Ambulatory Visit: Payer: Self-pay | Admitting: Obstetrics and Gynecology

## 2020-10-14 DIAGNOSIS — Z1231 Encounter for screening mammogram for malignant neoplasm of breast: Secondary | ICD-10-CM

## 2020-11-02 DIAGNOSIS — M9903 Segmental and somatic dysfunction of lumbar region: Secondary | ICD-10-CM | POA: Diagnosis not present

## 2020-11-02 DIAGNOSIS — M9907 Segmental and somatic dysfunction of upper extremity: Secondary | ICD-10-CM | POA: Diagnosis not present

## 2020-11-02 DIAGNOSIS — M9902 Segmental and somatic dysfunction of thoracic region: Secondary | ICD-10-CM | POA: Diagnosis not present

## 2020-11-02 DIAGNOSIS — M9901 Segmental and somatic dysfunction of cervical region: Secondary | ICD-10-CM | POA: Diagnosis not present

## 2020-11-03 DIAGNOSIS — Z01419 Encounter for gynecological examination (general) (routine) without abnormal findings: Secondary | ICD-10-CM | POA: Diagnosis not present

## 2020-11-03 DIAGNOSIS — M810 Age-related osteoporosis without current pathological fracture: Secondary | ICD-10-CM | POA: Diagnosis not present

## 2020-11-04 ENCOUNTER — Other Ambulatory Visit: Payer: Self-pay | Admitting: Obstetrics and Gynecology

## 2020-11-04 DIAGNOSIS — M810 Age-related osteoporosis without current pathological fracture: Secondary | ICD-10-CM

## 2020-11-12 ENCOUNTER — Ambulatory Visit
Admission: RE | Admit: 2020-11-12 | Discharge: 2020-11-12 | Disposition: A | Discharge status: Home / Self Care | Payer: Medicare Other | Source: Ambulatory Visit | Attending: Obstetrics and Gynecology | Admitting: Obstetrics and Gynecology

## 2020-11-12 ENCOUNTER — Other Ambulatory Visit: Payer: Self-pay

## 2020-11-12 DIAGNOSIS — Z1231 Encounter for screening mammogram for malignant neoplasm of breast: Secondary | ICD-10-CM

## 2020-11-16 DIAGNOSIS — Z23 Encounter for immunization: Secondary | ICD-10-CM | POA: Diagnosis not present

## 2020-11-30 DIAGNOSIS — M9901 Segmental and somatic dysfunction of cervical region: Secondary | ICD-10-CM | POA: Diagnosis not present

## 2020-11-30 DIAGNOSIS — M9903 Segmental and somatic dysfunction of lumbar region: Secondary | ICD-10-CM | POA: Diagnosis not present

## 2020-11-30 DIAGNOSIS — M9902 Segmental and somatic dysfunction of thoracic region: Secondary | ICD-10-CM | POA: Diagnosis not present

## 2020-11-30 DIAGNOSIS — M9907 Segmental and somatic dysfunction of upper extremity: Secondary | ICD-10-CM | POA: Diagnosis not present

## 2020-12-28 DIAGNOSIS — M9902 Segmental and somatic dysfunction of thoracic region: Secondary | ICD-10-CM | POA: Diagnosis not present

## 2020-12-28 DIAGNOSIS — M9901 Segmental and somatic dysfunction of cervical region: Secondary | ICD-10-CM | POA: Diagnosis not present

## 2020-12-28 DIAGNOSIS — M9907 Segmental and somatic dysfunction of upper extremity: Secondary | ICD-10-CM | POA: Diagnosis not present

## 2020-12-28 DIAGNOSIS — M9903 Segmental and somatic dysfunction of lumbar region: Secondary | ICD-10-CM | POA: Diagnosis not present

## 2020-12-31 DIAGNOSIS — Z23 Encounter for immunization: Secondary | ICD-10-CM | POA: Diagnosis not present

## 2021-01-06 DIAGNOSIS — F418 Other specified anxiety disorders: Secondary | ICD-10-CM | POA: Diagnosis not present

## 2021-01-06 DIAGNOSIS — E7800 Pure hypercholesterolemia, unspecified: Secondary | ICD-10-CM | POA: Diagnosis not present

## 2021-01-06 DIAGNOSIS — E039 Hypothyroidism, unspecified: Secondary | ICD-10-CM | POA: Diagnosis not present

## 2021-01-06 DIAGNOSIS — M810 Age-related osteoporosis without current pathological fracture: Secondary | ICD-10-CM | POA: Diagnosis not present

## 2021-01-06 DIAGNOSIS — Z79899 Other long term (current) drug therapy: Secondary | ICD-10-CM | POA: Diagnosis not present

## 2021-01-06 DIAGNOSIS — Z720 Tobacco use: Secondary | ICD-10-CM | POA: Diagnosis not present

## 2021-01-06 DIAGNOSIS — Z1211 Encounter for screening for malignant neoplasm of colon: Secondary | ICD-10-CM | POA: Diagnosis not present

## 2021-01-06 DIAGNOSIS — Z0001 Encounter for general adult medical examination with abnormal findings: Secondary | ICD-10-CM | POA: Diagnosis not present

## 2021-01-06 DIAGNOSIS — Z01419 Encounter for gynecological examination (general) (routine) without abnormal findings: Secondary | ICD-10-CM | POA: Diagnosis not present

## 2021-01-12 DIAGNOSIS — Z1211 Encounter for screening for malignant neoplasm of colon: Secondary | ICD-10-CM | POA: Diagnosis not present

## 2021-02-01 DIAGNOSIS — M9901 Segmental and somatic dysfunction of cervical region: Secondary | ICD-10-CM | POA: Diagnosis not present

## 2021-02-01 DIAGNOSIS — M9907 Segmental and somatic dysfunction of upper extremity: Secondary | ICD-10-CM | POA: Diagnosis not present

## 2021-02-01 DIAGNOSIS — M9903 Segmental and somatic dysfunction of lumbar region: Secondary | ICD-10-CM | POA: Diagnosis not present

## 2021-02-01 DIAGNOSIS — M9902 Segmental and somatic dysfunction of thoracic region: Secondary | ICD-10-CM | POA: Diagnosis not present

## 2021-03-08 DIAGNOSIS — M9901 Segmental and somatic dysfunction of cervical region: Secondary | ICD-10-CM | POA: Diagnosis not present

## 2021-03-08 DIAGNOSIS — M9902 Segmental and somatic dysfunction of thoracic region: Secondary | ICD-10-CM | POA: Diagnosis not present

## 2021-03-08 DIAGNOSIS — M9903 Segmental and somatic dysfunction of lumbar region: Secondary | ICD-10-CM | POA: Diagnosis not present

## 2021-03-08 DIAGNOSIS — M9907 Segmental and somatic dysfunction of upper extremity: Secondary | ICD-10-CM | POA: Diagnosis not present

## 2021-03-22 DIAGNOSIS — M9901 Segmental and somatic dysfunction of cervical region: Secondary | ICD-10-CM | POA: Diagnosis not present

## 2021-03-22 DIAGNOSIS — M9903 Segmental and somatic dysfunction of lumbar region: Secondary | ICD-10-CM | POA: Diagnosis not present

## 2021-03-22 DIAGNOSIS — M9907 Segmental and somatic dysfunction of upper extremity: Secondary | ICD-10-CM | POA: Diagnosis not present

## 2021-03-22 DIAGNOSIS — M9902 Segmental and somatic dysfunction of thoracic region: Secondary | ICD-10-CM | POA: Diagnosis not present

## 2021-03-29 DIAGNOSIS — M9902 Segmental and somatic dysfunction of thoracic region: Secondary | ICD-10-CM | POA: Diagnosis not present

## 2021-03-29 DIAGNOSIS — M9907 Segmental and somatic dysfunction of upper extremity: Secondary | ICD-10-CM | POA: Diagnosis not present

## 2021-03-29 DIAGNOSIS — M9901 Segmental and somatic dysfunction of cervical region: Secondary | ICD-10-CM | POA: Diagnosis not present

## 2021-03-29 DIAGNOSIS — M9903 Segmental and somatic dysfunction of lumbar region: Secondary | ICD-10-CM | POA: Diagnosis not present

## 2021-04-13 DIAGNOSIS — M9903 Segmental and somatic dysfunction of lumbar region: Secondary | ICD-10-CM | POA: Diagnosis not present

## 2021-04-13 DIAGNOSIS — M9901 Segmental and somatic dysfunction of cervical region: Secondary | ICD-10-CM | POA: Diagnosis not present

## 2021-04-13 DIAGNOSIS — M9907 Segmental and somatic dysfunction of upper extremity: Secondary | ICD-10-CM | POA: Diagnosis not present

## 2021-04-13 DIAGNOSIS — M9902 Segmental and somatic dysfunction of thoracic region: Secondary | ICD-10-CM | POA: Diagnosis not present

## 2021-04-22 ENCOUNTER — Ambulatory Visit
Admission: RE | Admit: 2021-04-22 | Discharge: 2021-04-22 | Disposition: A | Discharge status: Home / Self Care | Payer: Medicare Other | Source: Ambulatory Visit | Attending: Obstetrics and Gynecology | Admitting: Obstetrics and Gynecology

## 2021-04-22 DIAGNOSIS — M8588 Other specified disorders of bone density and structure, other site: Secondary | ICD-10-CM | POA: Diagnosis not present

## 2021-04-22 DIAGNOSIS — Z780 Asymptomatic menopausal state: Secondary | ICD-10-CM | POA: Diagnosis not present

## 2021-04-22 DIAGNOSIS — M810 Age-related osteoporosis without current pathological fracture: Secondary | ICD-10-CM | POA: Diagnosis not present

## 2021-05-16 DIAGNOSIS — M810 Age-related osteoporosis without current pathological fracture: Secondary | ICD-10-CM | POA: Diagnosis not present

## 2021-05-17 DIAGNOSIS — M9903 Segmental and somatic dysfunction of lumbar region: Secondary | ICD-10-CM | POA: Diagnosis not present

## 2021-05-17 DIAGNOSIS — M9902 Segmental and somatic dysfunction of thoracic region: Secondary | ICD-10-CM | POA: Diagnosis not present

## 2021-05-17 DIAGNOSIS — M9907 Segmental and somatic dysfunction of upper extremity: Secondary | ICD-10-CM | POA: Diagnosis not present

## 2021-05-17 DIAGNOSIS — M9901 Segmental and somatic dysfunction of cervical region: Secondary | ICD-10-CM | POA: Diagnosis not present

## 2021-06-14 DIAGNOSIS — M9902 Segmental and somatic dysfunction of thoracic region: Secondary | ICD-10-CM | POA: Diagnosis not present

## 2021-06-14 DIAGNOSIS — M9905 Segmental and somatic dysfunction of pelvic region: Secondary | ICD-10-CM | POA: Diagnosis not present

## 2021-06-14 DIAGNOSIS — M9903 Segmental and somatic dysfunction of lumbar region: Secondary | ICD-10-CM | POA: Diagnosis not present

## 2021-06-14 DIAGNOSIS — M9901 Segmental and somatic dysfunction of cervical region: Secondary | ICD-10-CM | POA: Diagnosis not present

## 2021-08-10 DIAGNOSIS — H353131 Nonexudative age-related macular degeneration, bilateral, early dry stage: Secondary | ICD-10-CM | POA: Diagnosis not present

## 2021-08-10 DIAGNOSIS — H2513 Age-related nuclear cataract, bilateral: Secondary | ICD-10-CM | POA: Diagnosis not present

## 2021-09-17 IMAGING — MG DIGITAL SCREENING BILAT W/ TOMO W/ CAD
8 series · 8 of 24 positions shown · non-contrast
Comparison: Previous exam(s).

CLINICAL DATA: Screening.

EXAM:
DIGITAL SCREENING BILATERAL MAMMOGRAM WITH TOMO AND CAD

[R CC synth-2D]
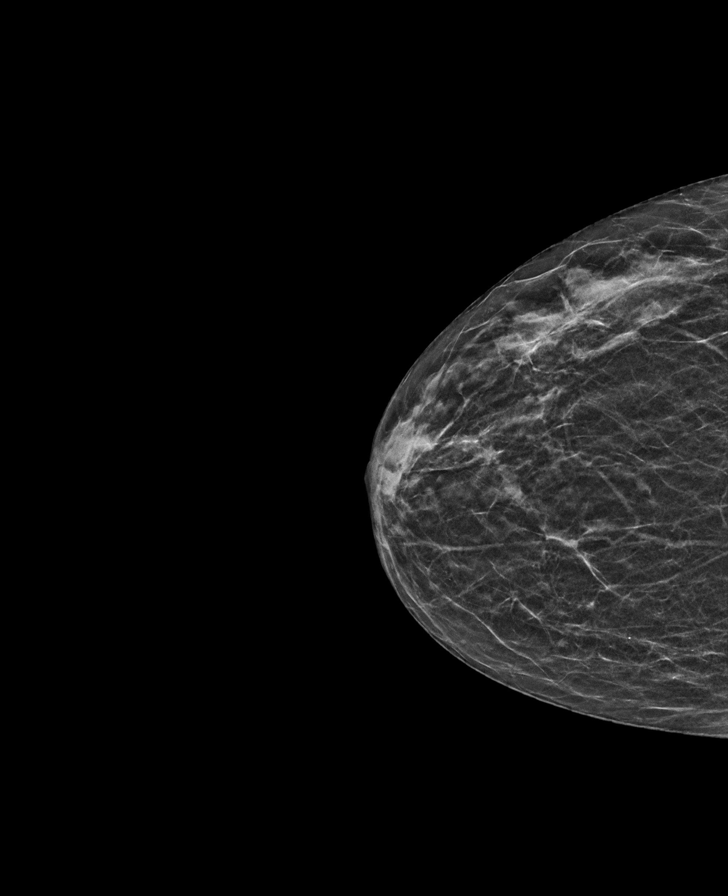

[R MLO synth-2D]
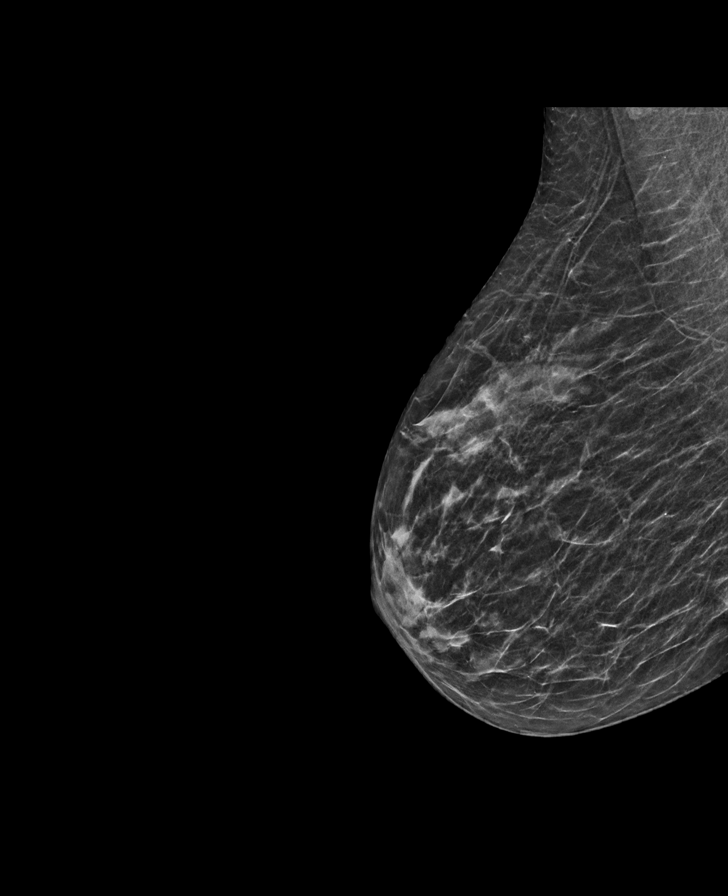

[L CC synth-2D]
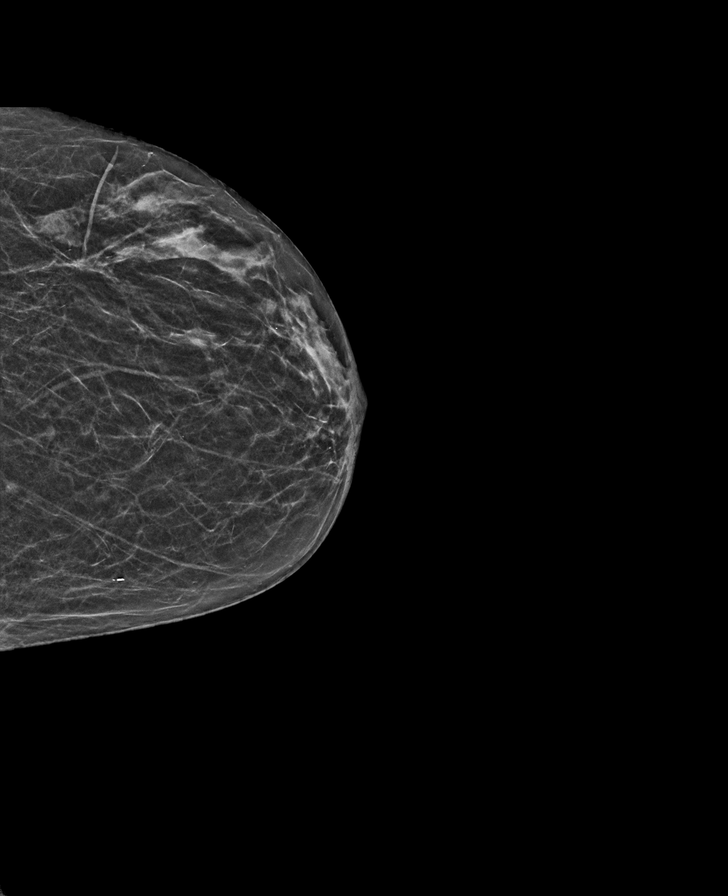

[L MLO synth-2D]
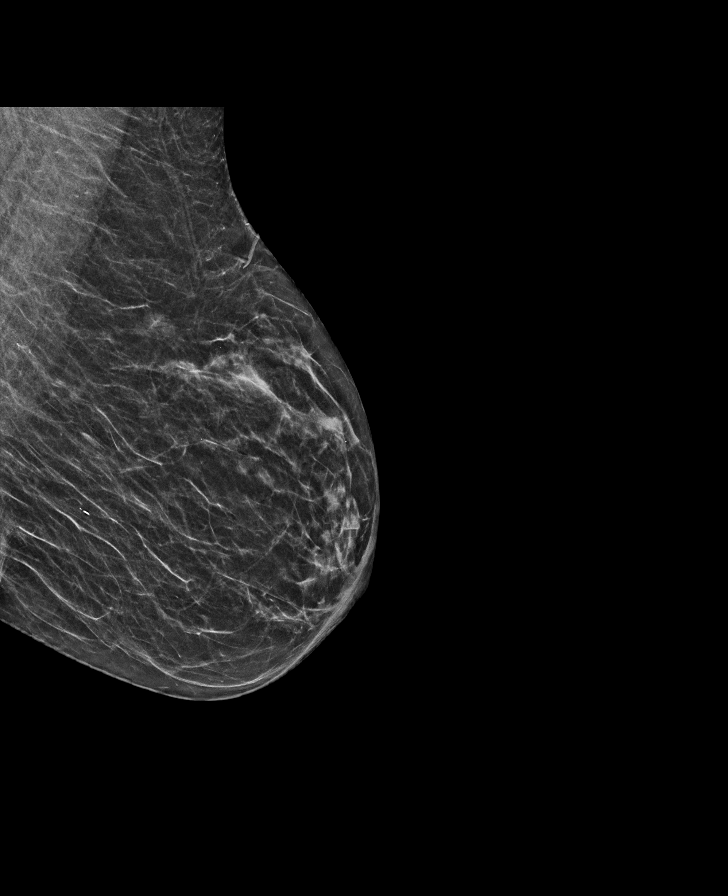

[R CC tomo · tomo slice 21/40.0]
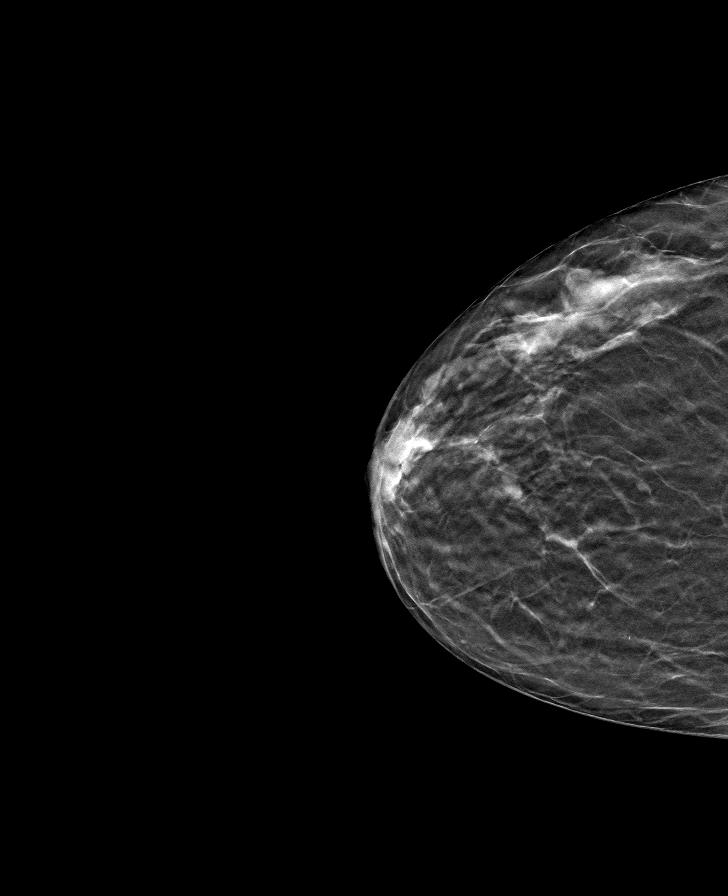

[L CC tomo · tomo slice 23/44.0]
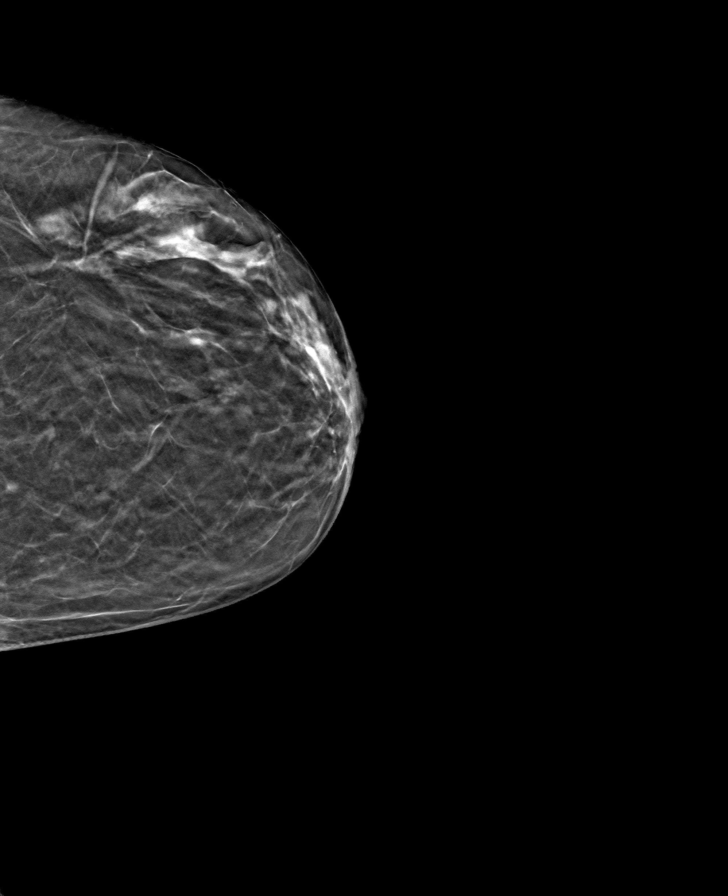

[R MLO tomo · tomo slice 23/45.0]
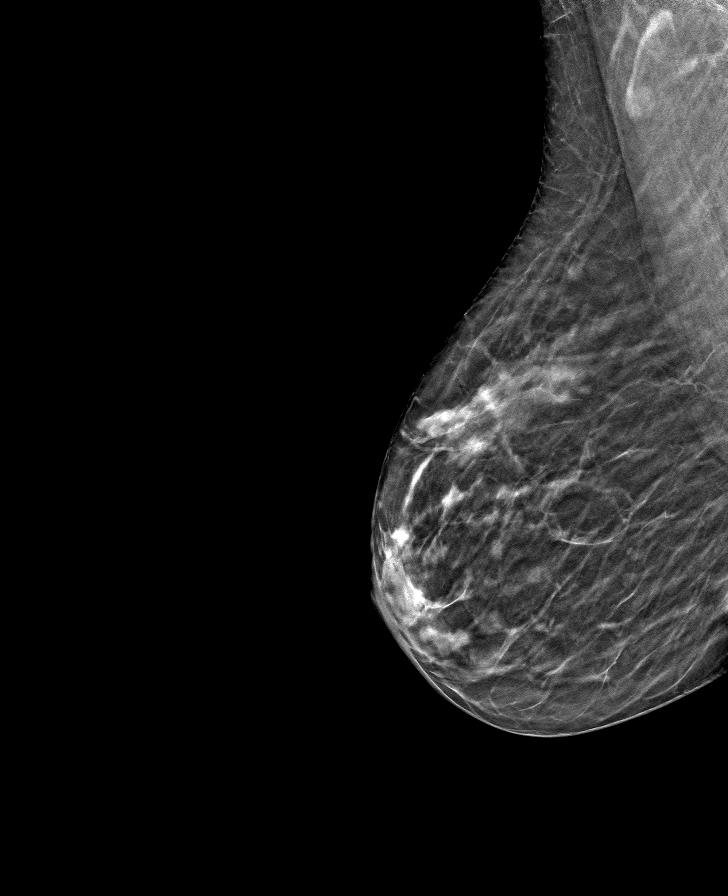

[L MLO tomo · tomo slice 25/49.0]
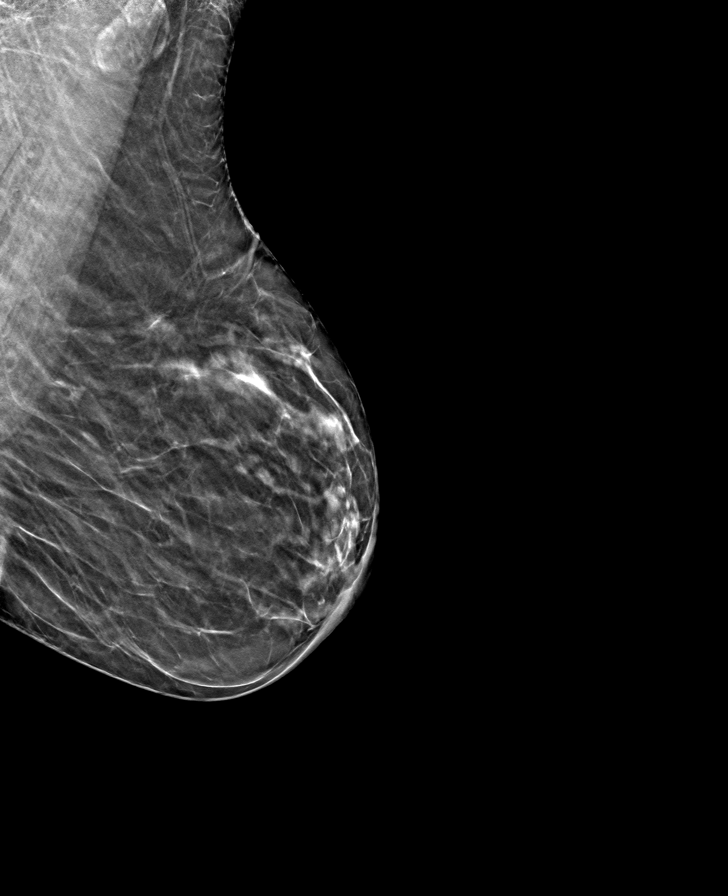

[8 of 24 positions shown; findings below may reference images not displayed]

ACR Breast Density Category b: There are scattered areas of
fibroglandular density.
FINDINGS: There are no findings suspicious for malignancy. Images were
processed with CAD.
IMPRESSION: No mammographic evidence of malignancy. A result letter of this
screening mammogram will be mailed directly to the patient.

RECOMMENDATION:
Screening mammogram in one year. (Code:[id number])

BI-RADS CATEGORY  1: Negative.

## 2021-10-07 ENCOUNTER — Other Ambulatory Visit: Payer: Self-pay | Admitting: Obstetrics and Gynecology

## 2021-10-07 DIAGNOSIS — Z1231 Encounter for screening mammogram for malignant neoplasm of breast: Secondary | ICD-10-CM

## 2021-11-17 DIAGNOSIS — H6122 Impacted cerumen, left ear: Secondary | ICD-10-CM | POA: Diagnosis not present

## 2021-11-17 DIAGNOSIS — M542 Cervicalgia: Secondary | ICD-10-CM | POA: Diagnosis not present

## 2021-11-24 DIAGNOSIS — Z23 Encounter for immunization: Secondary | ICD-10-CM | POA: Diagnosis not present

## 2021-11-25 ENCOUNTER — Ambulatory Visit
Admission: RE | Admit: 2021-11-25 | Discharge: 2021-11-25 | Disposition: A | Discharge status: Home / Self Care | Payer: Medicare Other | Source: Ambulatory Visit | Attending: Obstetrics and Gynecology | Admitting: Obstetrics and Gynecology

## 2021-11-25 DIAGNOSIS — Z1231 Encounter for screening mammogram for malignant neoplasm of breast: Secondary | ICD-10-CM | POA: Diagnosis not present

## 2022-01-25 DIAGNOSIS — M810 Age-related osteoporosis without current pathological fracture: Secondary | ICD-10-CM | POA: Diagnosis not present

## 2022-01-25 DIAGNOSIS — Z1211 Encounter for screening for malignant neoplasm of colon: Secondary | ICD-10-CM | POA: Diagnosis not present

## 2022-01-25 DIAGNOSIS — Z0001 Encounter for general adult medical examination with abnormal findings: Secondary | ICD-10-CM | POA: Diagnosis not present

## 2022-01-25 DIAGNOSIS — Z79899 Other long term (current) drug therapy: Secondary | ICD-10-CM | POA: Diagnosis not present

## 2022-01-25 DIAGNOSIS — Z720 Tobacco use: Secondary | ICD-10-CM | POA: Diagnosis not present

## 2022-01-25 DIAGNOSIS — E7800 Pure hypercholesterolemia, unspecified: Secondary | ICD-10-CM | POA: Diagnosis not present

## 2022-01-25 DIAGNOSIS — E039 Hypothyroidism, unspecified: Secondary | ICD-10-CM | POA: Diagnosis not present

## 2022-01-30 DIAGNOSIS — Z1211 Encounter for screening for malignant neoplasm of colon: Secondary | ICD-10-CM | POA: Diagnosis not present

## 2022-02-06 DIAGNOSIS — Z23 Encounter for immunization: Secondary | ICD-10-CM | POA: Diagnosis not present

## 2022-07-02 DIAGNOSIS — N390 Urinary tract infection, site not specified: Secondary | ICD-10-CM | POA: Diagnosis not present

## 2022-07-02 DIAGNOSIS — R300 Dysuria: Secondary | ICD-10-CM | POA: Diagnosis not present

## 2022-08-25 DIAGNOSIS — D485 Neoplasm of uncertain behavior of skin: Secondary | ICD-10-CM | POA: Diagnosis not present

## 2022-08-25 DIAGNOSIS — L821 Other seborrheic keratosis: Secondary | ICD-10-CM | POA: Diagnosis not present

## 2022-08-25 DIAGNOSIS — D0461 Carcinoma in situ of skin of right upper limb, including shoulder: Secondary | ICD-10-CM | POA: Diagnosis not present

## 2022-08-28 DIAGNOSIS — S90562A Insect bite (nonvenomous), left ankle, initial encounter: Secondary | ICD-10-CM | POA: Diagnosis not present

## 2022-09-18 DIAGNOSIS — D0461 Carcinoma in situ of skin of right upper limb, including shoulder: Secondary | ICD-10-CM | POA: Diagnosis not present

## 2022-09-18 DIAGNOSIS — L821 Other seborrheic keratosis: Secondary | ICD-10-CM | POA: Diagnosis not present

## 2022-09-27 DIAGNOSIS — H2513 Age-related nuclear cataract, bilateral: Secondary | ICD-10-CM | POA: Diagnosis not present

## 2022-09-27 DIAGNOSIS — H353131 Nonexudative age-related macular degeneration, bilateral, early dry stage: Secondary | ICD-10-CM | POA: Diagnosis not present

## 2022-10-19 ENCOUNTER — Other Ambulatory Visit: Payer: Self-pay | Admitting: Obstetrics and Gynecology

## 2022-10-19 DIAGNOSIS — Z1231 Encounter for screening mammogram for malignant neoplasm of breast: Secondary | ICD-10-CM

## 2022-11-08 DIAGNOSIS — L905 Scar conditions and fibrosis of skin: Secondary | ICD-10-CM | POA: Diagnosis not present

## 2022-11-08 DIAGNOSIS — L089 Local infection of the skin and subcutaneous tissue, unspecified: Secondary | ICD-10-CM | POA: Diagnosis not present

## 2022-11-08 DIAGNOSIS — D485 Neoplasm of uncertain behavior of skin: Secondary | ICD-10-CM | POA: Diagnosis not present

## 2022-11-23 DIAGNOSIS — Z23 Encounter for immunization: Secondary | ICD-10-CM | POA: Diagnosis not present

## 2022-11-27 ENCOUNTER — Ambulatory Visit: Payer: Medicare Other

## 2022-12-01 DIAGNOSIS — Z01419 Encounter for gynecological examination (general) (routine) without abnormal findings: Secondary | ICD-10-CM | POA: Diagnosis not present

## 2022-12-01 DIAGNOSIS — M810 Age-related osteoporosis without current pathological fracture: Secondary | ICD-10-CM | POA: Diagnosis not present

## 2022-12-19 ENCOUNTER — Ambulatory Visit
Admission: RE | Admit: 2022-12-19 | Discharge: 2022-12-19 | Disposition: A | Discharge status: Home / Self Care | Payer: Medicare Other | Source: Ambulatory Visit | Attending: Obstetrics and Gynecology | Admitting: Obstetrics and Gynecology

## 2022-12-19 DIAGNOSIS — Z1231 Encounter for screening mammogram for malignant neoplasm of breast: Secondary | ICD-10-CM

## 2022-12-26 DIAGNOSIS — Z23 Encounter for immunization: Secondary | ICD-10-CM | POA: Diagnosis not present

## 2023-01-22 DIAGNOSIS — L570 Actinic keratosis: Secondary | ICD-10-CM | POA: Diagnosis not present

## 2023-01-22 DIAGNOSIS — D239 Other benign neoplasm of skin, unspecified: Secondary | ICD-10-CM | POA: Diagnosis not present

## 2023-01-22 DIAGNOSIS — Z85828 Personal history of other malignant neoplasm of skin: Secondary | ICD-10-CM | POA: Diagnosis not present

## 2023-01-22 DIAGNOSIS — L918 Other hypertrophic disorders of the skin: Secondary | ICD-10-CM | POA: Diagnosis not present

## 2023-01-22 DIAGNOSIS — L578 Other skin changes due to chronic exposure to nonionizing radiation: Secondary | ICD-10-CM | POA: Diagnosis not present

## 2023-01-22 DIAGNOSIS — L821 Other seborrheic keratosis: Secondary | ICD-10-CM | POA: Diagnosis not present

## 2023-01-22 DIAGNOSIS — L814 Other melanin hyperpigmentation: Secondary | ICD-10-CM | POA: Diagnosis not present

## 2023-02-02 DIAGNOSIS — M810 Age-related osteoporosis without current pathological fracture: Secondary | ICD-10-CM | POA: Diagnosis not present

## 2023-02-02 DIAGNOSIS — E039 Hypothyroidism, unspecified: Secondary | ICD-10-CM | POA: Diagnosis not present

## 2023-02-02 DIAGNOSIS — Z79899 Other long term (current) drug therapy: Secondary | ICD-10-CM | POA: Diagnosis not present

## 2023-02-02 DIAGNOSIS — Z0001 Encounter for general adult medical examination with abnormal findings: Secondary | ICD-10-CM | POA: Diagnosis not present

## 2023-02-02 DIAGNOSIS — E7800 Pure hypercholesterolemia, unspecified: Secondary | ICD-10-CM | POA: Diagnosis not present

## 2023-02-02 DIAGNOSIS — Z720 Tobacco use: Secondary | ICD-10-CM | POA: Diagnosis not present

## 2023-02-02 DIAGNOSIS — Z1211 Encounter for screening for malignant neoplasm of colon: Secondary | ICD-10-CM | POA: Diagnosis not present

## 2023-02-02 DIAGNOSIS — Z716 Tobacco abuse counseling: Secondary | ICD-10-CM | POA: Diagnosis not present

## 2023-02-06 DIAGNOSIS — Z1211 Encounter for screening for malignant neoplasm of colon: Secondary | ICD-10-CM | POA: Diagnosis not present

## 2023-06-07 DIAGNOSIS — E039 Hypothyroidism, unspecified: Secondary | ICD-10-CM | POA: Diagnosis not present

## 2023-11-16 DIAGNOSIS — Z23 Encounter for immunization: Secondary | ICD-10-CM | POA: Diagnosis not present

## 2023-11-19 ENCOUNTER — Other Ambulatory Visit: Payer: Self-pay | Admitting: Obstetrics and Gynecology

## 2023-11-19 DIAGNOSIS — Z1231 Encounter for screening mammogram for malignant neoplasm of breast: Secondary | ICD-10-CM

## 2023-11-21 DIAGNOSIS — Z23 Encounter for immunization: Secondary | ICD-10-CM | POA: Diagnosis not present

## 2023-12-27 ENCOUNTER — Ambulatory Visit
Admission: RE | Admit: 2023-12-27 | Discharge: 2023-12-27 | Disposition: A | Source: Ambulatory Visit | Attending: Obstetrics and Gynecology | Admitting: Obstetrics and Gynecology

## 2023-12-27 DIAGNOSIS — Z1231 Encounter for screening mammogram for malignant neoplasm of breast: Secondary | ICD-10-CM | POA: Diagnosis not present

## 2024-01-29 DIAGNOSIS — H353131 Nonexudative age-related macular degeneration, bilateral, early dry stage: Secondary | ICD-10-CM | POA: Diagnosis not present

## 2024-01-29 DIAGNOSIS — H2513 Age-related nuclear cataract, bilateral: Secondary | ICD-10-CM | POA: Diagnosis not present
